# Patient Record
Sex: Female | Born: 1960 | Race: Black or African American | Hispanic: No | Marital: Single | Smoking: Never smoker
Health system: Western US, Academic
[De-identification: ages and names within clinical notes are randomized; demographics above are authoritative.]

---

## 2008-02-16 MED ORDER — TRIMETHOPRIM-SULFAMETHOXAZOLE 160 MG-800 MG TAB
160-800 mg | ORAL | Status: AC
Start: 2008-02-16 — End: 2008-02-16
  Administered 2008-02-16: 06:00:00 via ORAL

## 2008-02-16 MED FILL — TRIMETHOPRIM-SULFAMETHOXAZOLE 160 MG-800 MG TAB: 160-800 mg | ORAL | Qty: 2

## 2008-02-16 NOTE — ED Provider Notes (Addendum)
HPI Comments: Pt with sore on right side of chin, drained Starry core tonight. Also sores in nose. Tongue sore for a few weeks.    Other  The history is provided by the patient. This is a new problem. The current episode started more than 2 days ago. The problem occurs constantly. The problem has been gradually worsening. Pertinent negatives include no chest pain, no abdominal pain, no headaches and no shortness of breath. Nothing worsens the symptoms. Nothing relieves the symptoms. She has tried nothing for the symptoms.        Past Medical History   Diagnosis Date   ??? Hypertension    ??? Other Ill-Defined Conditions      morbid obesity          Past Surgical History   Procedure Date   ??? Hx gi      gastric bypass   ??? Hx cholecystectomy            No family history on file.     History   Social History   ??? Marital Status: Single     Spouse Name: N/A     Number of Children: N/A   ??? Years of Education: N/A   Occupational History   ??? Not on file.   Social History Main Topics   ??? Tobacco Use: Never   ??? Alcohol Use: No   ??? Drug Use: No   ??? Sexually Active: No   Other Topics Concern   ??? Not on file   Social History Narrative   ??? No narrative on file           ALLERGIES: Review of patient's allergies indicates no known allergies.      Review of Systems   Constitutional: Negative for fever and chills.   HENT:        Sore mouth   Respiratory: Negative for shortness of breath.    Cardiovascular: Negative for chest pain.   Gastrointestinal: Negative for abdominal pain.   Skin:        Lesion on chin   Neurological: Negative for headaches.   All other systems reviewed and are negative.      Filed Vitals:    02/15/2008  9:54 PM   BP: 195/101   Pulse: 84   Temp: 98.3 ??F (36.8 ??C)   Resp: 20   Height: 5\' 5"  (1.651 m)   Weight: 262 lb (118.842 kg)   SpO2: 96%              Physical Exam   Nursing note and vitals reviewed.   Constitutional: She is oriented. She appears well-developed and well-nourished. She appears not diaphoretic. No distress.   HENT:   Head: Normocephalic and atraumatic.       Right Ear: External ear normal.   Left Ear: External ear normal.   Nose: Nose normal.        Tongue red and slick   Eyes: Conjunctivae and extraocular motions are normal. Pupils are equal, round, and reactive to light.   Neck: Normal range of motion. Neck supple.   Cardiovascular: Normal rate and normal heart sounds.    Pulmonary/Chest: Effort normal and breath sounds normal.   Abdominal: Soft. Bowel sounds are normal. She exhibits no distension.   Musculoskeletal: Normal range of motion. She exhibits no edema and no tenderness.   Neurological: She is alert and oriented. No cranial nerve deficit. She exhibits normal muscle tone. Coordination normal.   Skin: Skin is warm and  dry. No rash noted. She is not diaphoretic. There is erythema. No pallor.   Psychiatric: She has a normal mood and affect. Her behavior is normal. Judgment and thought content normal.        Coding

## 2008-02-16 NOTE — ED Notes (Signed)
Pt awake alert voices understanding of discharge and RX instructions

## 2011-03-08 ENCOUNTER — Emergency Department
Admit: 2011-03-08 | Discharge: 2011-03-08 | Disposition: A | Discharge status: Home Health | Payer: Self-pay | Attending: Emergency Medicine | Admitting: Emergency Medicine

## 2011-03-08 LAB — CBC WITH DIFF, BLOOD
Abs Lymphs: 1.7 10*3/uL (ref 0.8–3.1)
Abs Monos: 0.5 10*3/uL (ref 0.2–0.8)
Absolute Neutrophil Count: 4 10*3/uL (ref 1.6–7.0)
Eosinophils: 2 % (ref 1–7)
Hct: 34.5 % (ref 34.0–45.0)
Hgb: 11.7 gm/dL (ref 11.2–15.7)
Imm Gran %: 1 % (ref 0–1)
Lymphocytes: 27 % (ref 19–53)
MCH: 31.1 pg (ref 26.0–32.0)
MCHC: 33.9 % (ref 32.0–36.0)
MCV: 91.8 um3 (ref 79.0–95.0)
MPV: 9.3 fL — ABNORMAL LOW (ref 9.4–12.4)
Monocytes: 8 % (ref 5–12)
Plt Count: 235 10*3/uL (ref 140–370)
RBC: 3.76 10*6/uL — ABNORMAL LOW (ref 3.90–5.20)
RDW: 12.5 % (ref 12.0–14.0)
Segs: 63 % (ref 34–71)
WBC: 6.2 10*3/uL (ref 4.0–10.0)

## 2011-03-08 LAB — URINALYSIS
Bilirubin: NEGATIVE
Bilirubin: NEGATIVE
Blood: NEGATIVE
Blood: NEGATIVE
Glucose: NEGATIVE
Glucose: NEGATIVE
Hyaline Cast: >5 — AB (ref 0–?)
Ketones: NEGATIVE
Ketones: NEGATIVE
Leuk Esterase: NEGATIVE
Nitrite: NEGATIVE
Nitrite: NEGATIVE
RBC: <1 (ref 0–?)
Specific Gravity: 1.036 — ABNORMAL HIGH (ref 1.002–1.030)
Specific Gravity: 1.047 — ABNORMAL HIGH (ref 1.002–1.030)
Urobilinogen: 2 — AB (ref 0.2–1)
Urobilinogen: 2 — AB (ref 0.2–1)
pH: 5.5 (ref 5.0–8.0)
pH: 5 (ref 5.0–8.0)

## 2011-03-08 LAB — LIVER PANEL, BLOOD
ALT (SGPT): 29 U/L (ref 0–33)
AST (SGOT): 37 U/L — ABNORMAL HIGH (ref 0–32)
Albumin: 4.3 g/dL (ref 3.5–5.2)
Alkaline Phos: 83 U/L (ref 35–140)
Bilirubin, Dir: <0.1 mg/dL (ref <0.2)
Bilirubin, Tot: 0.2 mg/dL (ref <1.2)
Total Protein: 7.2 g/dL (ref 6.0–8.0)

## 2011-03-08 LAB — BASIC METABOLIC PANEL, BLOOD
BUN: 24 mg/dL — ABNORMAL HIGH (ref 6–20)
Bicarbonate: 30 mmol/L — ABNORMAL HIGH (ref 22–29)
Calcium: 9.3 mg/dL (ref 8.6–10.5)
Chloride: 99 mmol/L (ref 98–107)
Creatinine: 1.16 mg/dL — ABNORMAL HIGH (ref 0.51–0.95)
Glucose: 80 mg/dL (ref 70–115)
Potassium: 4 mmol/L (ref 3.5–5.1)
Sodium: 138 mmol/L (ref 136–145)

## 2011-03-08 LAB — GFR: GFR: >60 mL/min

## 2011-03-08 LAB — LIPASE, BLOOD: Lipase: 19 U/L (ref 13–60)

## 2011-03-08 LAB — KOH PREP: KOH Preparation: NONE SEEN

## 2011-03-08 LAB — WET PREP

## 2011-03-08 MED ORDER — PENICILLIN V POTASSIUM 500 MG OR TABS
500.0000 mg | ORAL_TABLET | Freq: Once | ORAL | Status: DC | PRN
Start: 2011-03-08 — End: 2011-03-08
  Filled 2011-03-08: qty 1

## 2011-03-08 MED ORDER — PROPARACAINE HCL 0.5 % OP SOLN
2.0000 [drp] | Freq: Once | OPHTHALMIC | Status: DC | PRN
Start: 2011-03-08 — End: 2011-03-08
  Filled 2011-03-08: qty 300

## 2011-03-08 MED ORDER — HYDROMORPHONE HCL 1 MG/ML IJ SOLN
1.0000 mg | Freq: Once | INTRAMUSCULAR | Status: DC | PRN
Start: 2011-03-08 — End: 2011-03-08
  Filled 2011-03-08: qty 1

## 2011-03-08 MED ORDER — ONDANSETRON 4 MG OR TBDP
4.0000 mg | ORAL_TABLET | Freq: Once | ORAL | Status: DC | PRN
Start: 2011-03-08 — End: 2011-03-08
  Filled 2011-03-08: qty 1

## 2011-03-08 MED ORDER — FLUORESCEIN SODIUM 1 MG OP STRP
1.0000 | ORAL_STRIP | Freq: Once | OPHTHALMIC | Status: DC | PRN
Start: 2011-03-08 — End: 2011-03-08
  Filled 2011-03-08: qty 1

## 2011-03-08 MED ORDER — OXYCODONE-ACETAMINOPHEN 5-325 MG OR TABS
2.0000 | ORAL_TABLET | Freq: Once | ORAL | Status: DC | PRN
Start: 2011-03-08 — End: 2011-03-08
  Filled 2011-03-08: qty 2

## 2011-03-11 NOTE — ED Notes (Addendum)
==============================   ATTENDING NOTE =============================    05/27 1659    Michelle Cord, MD Attending            seen with dr. Francesco Runner          50 yo female with lower abd pain. cramping, started last          night. no fevers. no urinary symptoms. sexually active with          one partner, uses condoms, no discharge. some vomiting and          diarrhea 3d ago but that has resolved. h/o gastric bypass          surgery 4 yrs ago [place]. pt reports intermittent          similar abd pain in the past. on evaluation, her symptoms          have resolved. previously chronically on percocet - has been          out of a few weeks.                     Pmhx/Shx: triage note confirmed                    PE:          htn, tachy          well appearing female in nad          eomi, o/p moist, poor dentition with multiple caries and          missing teeth, no obvious abscess - pt has appt with dentist          on tues          neck not stiff          rrr -m          lungs cta          abd soft, nontender, no guarding or rebound, no cva ttp          ext without edema or asymmetry                    I/P          50 yo female with intermittent chronic lower abd pain, here          with cramping in lower abd since last night, now resolved.          appears well, no ttp on exam. agree with labs, urine, pelvic          exam, do not think imaging is needed at this time as          symptoms have resolved

## 2011-03-11 NOTE — ED Notes (Addendum)
=================================   ORDERS ==================================    ACT- MD    MD   RN   AP                                           IVE DATE  TIME TIME TIME  TREATMENT ORDERS                       MD   RN     ---------------------------------------------------------------------------

## 2011-03-11 NOTE — ED Notes (Addendum)
 50 year old female with a history of hypertension and a          gastric bypass 4 years ago here for evaluation of abdominal          pain in her suprapubic area. Patient reports mild nausea          with an episode of vomiting and that she says was nonbloody          nonbilious earlier today. She denies fevers, chest pain or          shortness of breath, back pain, flank pain, dysuria, or any          other concerns. She has not had dysuria or abnormal vaginal          discharge.      PAST MEDICAL/SURGICAL HISTORY          N/A           FAMILY HISTORY- N/A                                                          SOCIAL HISTORY-       SMOKING   ALCOHOL   ILLICIT DRUGS   HOMELESS   MARRIED   EMPLOYED         N/A       N/A           N/A           N/A        S             DISABLED  OTHER- N/A                                                                   REVIEW OF SYSTEMS- N/A                                                         PHYSICAL EXAM  05/27 1712    Meade Maw , MD            Vitals: Vitals as noted above, afebrile, O2 sat low-normal          at 95% on room air          Gen: WDWN aa f, a&ox3, in NAD, speaking in full sentences          Skin: No suspicious lesions or rashes          HEENT: NCAT, PERRL, EOMI, no lad, o/p clear, MMM          Neck: Supple, nontender          CVS: RRR, no m/r/g          Lungs: CTAB, no w/c          Abd: Soft, ND, mild tenderness to palpation in suprapubic          area, no r/g  Ext: MAE, no c/e/e          Back: No ttp in midline c/t/l/s spine          Neuro: CN 2-12 intact, normal strength and sensation          throughout          GU: external vaginal mucosa normal, vaginal vault with white          discharge, cervix normal appearing, no cervical motion          tenderness, no adnexal tenderness      IMPRESSION  05/27 2134    Meade Maw , MD            abdominal pain of unclear etiology--suprapubic tenderness          without localizing right lower  quadrant or left lower          quadrant tenderness to suspect appendicitis or          diverticulitis, patient is afebrile, denies high-risk sexual          contacts, denies abnormal vaginal discharge, no dysuria to          suggest UTI--pain is now resolved after treatment with pain          medicines--we'll give patient strict return precautions for          reevaluation if her pain returns or worsens in          anyway--patient is also aware that her blood pressure is          elevated, takes antihypertensives, and will keep her blood          pressure log for her outpatient doctor--patient states she          frequently has nausea from eating too much ever said she had          a gastric bypass--she states her level of nausea is normal          for her--also reports dental pain in her right posterior          upper molar where she has a cracked tooth--no fluctuance of          the gums--suspect dental abscess and will treat with          penicillin      MEDICAL DECISION MAKING  05/27 2134    Meade Maw , MD            Patient was discharged with the following diagnosis and plan          provided to the patient in discharge paperwork. Patient          verbalized understanding and agreement with the following:          DIAGNOSIS: Dental Pain          Abdominal Pain of Unclear Etiology           TREATMENT:           1. Take Percocet and Penicillin as prescribed.           FOLLOW-UP: Call your primary care doctor for follow-up on          Tuesday for re-evaluation.          Call your Dentist for follow-up on Tuesday.          RETURN TO THE ED if your symptoms worsen in any way.    CASE PRESENTED TO- N/A                                                         =============================  PHYSICIAN NOTES =============================    05/27 1828    Meade Maw , MD            PATIENT WAS REASSESSED AND NOW STATES HER ABD PAIN IS          COMPLETELY GONE. STILL REPORTS MILD PAIN IN HER RIGHT LEG,          BUT  WOULD LIKE TO GO HOME. GIVEN STRICT RETURN PRECAUTIONS.    05/27 1902    Beola Cord, MD Attending            hyptertensive, no complaints. just took her bp meds, does          not want to stay for recheck. states she'll take her bp at          home    05/27 1915    Meade Maw , MD            upon discharge, patient's bp was checked and was found to be          elevated. She states she is due for her bp meds and took her          medication. She refuses to stay for a repeat BP check and          says that she will keep a bp log for follow-up with her pmd          on tuesday. All symptoms now resolved.      ============================= PROCEDURE NOTES =============================      ================================ LAB NOTES ================================    05/27 1532    Meade Maw , MD            cr 1.16    05/27 1559    Meade Maw , MD            UA DIRTY--WILL REPEAT, ? INFECTION    05/27 1748    Meade Maw , MD            ua neg for infection      ================================= IMAGES ==================================      ============================== MD DISCHARGE ===============================    DISCHARGE PHYSICIAN- Meade Maw                                              CHIEF COMPLAINT- Abdominal Pain       CASE PRESENTED TO- Alicia Minns        CONDITION OF DISCHARGE- Improving       WAS THIS VISIT FOR A WORK RELATED ILLNESS OR INJURY- No                      PRIMARY CARE PHYSICIAN- [facility]                                       HAS PCP BEEN CONTACTED- N/A           H&P NOTE WAS DICTATED- No     +-------------------------- DISCHARGE DIAGNOSIS --------------------------+          789.00  Groin Pain                                                     +-------------------------  DISCHARGE INSTRUCTIONS ------------------------+    05/27 1838    Meade Maw , MD            PHYSICIAN- Beola Cord, MD Attending  FOLLOW-UP(DAYS)- N/A              APPOINTMENT- N/A   RETURN TO- N/A               LANGUAGE- English             INSTRUCTIONS-          MEDICATIONS-          REFERRAL CLINICS-            [facility] - [facility]                                            [facility] - [facility]                                      REFERRAL PHYSICIANS-          ADDITIONAL INSTRUCTIONS-            N/A                                                   05/27 1842    Meade Maw , MD            PHYSICIAN- Beola Cord, MD Attending  FOLLOW-UP(DAYS)- N/A              APPOINTMENT- N/A   RETURN TO- N/A              LANGUAGE- English             INSTRUCTIONS-          MEDICATIONS-          REFERRAL CLINICS-            [facility] - [facility]                                            D[facility] - [facility]                                      REFERRAL PHYSICIANS-          ADDITIONAL INSTRUCTIONS-            N/A                                                     +----------------------- MEDICATION RECONCILIATION -----------------------+    ---------------------------- CURRENT MEDICATION ---------------------------  MEDICATION NAME                       DOSAGE  FREQUENCY  ---------------------------------------------------------------------------    Gabapentin                            N/A                   N/A             Enalapril Maleate                     N/A                   N/A             Norco                                 N/A                   N/A             Clonidine HCl                         N/A                   N/A             Quetiapine Fumarate                   N/A                   N/A             Atenolol Please review with MD.       N/A                   N/A               ---------------------------- STOPPED MEDICATION ---------------------------  MEDICATION NAME                       DOSAGE                FREQUENCY  ---------------------------------------------------------------------------      ---------------------------- UPDATED MEDICATION  ---------------------------  MEDICATION NAME                       DOSAGE                FREQUENCY  ---------------------------------------------------------------------------      ----------------------------- ADDED MEDICATION ----------------------------  MEDICATION NAME                       DOSAGE                FREQUENCY  ---------------------------------------------------------------------------    Percocet Please review with MD.       1 MG                  When Necessary  Penicillin V Potassium                1 MG                  Every 6 Hours       ============================= FOLLOW UP NOTES =============================    05/29 03/10/2011  1536    Kathline Magic, RN  Reason for Addendum or Follow Up: Routine ED Patient Call          Back          Action Taken: Patient contacted by telephone          happy with care.  feeling better.

## 2011-03-11 NOTE — ED Notes (Addendum)
 ============================== ADMIT SUMMARY ==============================    RECEIVING NURSE -    ED NURSE -     +------------------------------- ALLERGIES -------------------------------+   No known drug allergies (03/08/2011);     +-------------------------- ADMITTING DIAGNOSIS --------------------------+                                                                                 +--------------------------- ADMITTING SERVICE ---------------------------+  ADMISSION SERVICE -    LEVEL OF CARE -    ATTENDING -    RESIDENT  -      +------------------------ MOST RECENT VITAL SIGNS ------------------------+  BP - 194/93                          PULSE -                                RESPIRATIONS -                       O2 SAT -                               TEMPERATURE -                        MODE -                                 GCS TOTAL -                                                                 PAIN -                               PAIN QUALITY -                         PAIN LOCATION -                                                             DATE/TIME - 03/08/2011 1908    +-------------------------------- FLUIDS ---------------------------------+  DATE  TIME  IV FLUID           L/R   LOCATION          SIZE   HUNG ABSORBED  ---------------------------------------------------------------------------  TOTAL IV:                         0 ml    TOTAL OUTPUT:     0 ml               TOTAL PO:                         0 ml    +------------------------------ MEDICATIONS ------------------------------+  DATE  TIME  MEDICATION                           VERIFYING RN      RN INIT  ---------------------------------------------------------------------------    05/27 1529  Hydromorphone HCl 1 MG IV                                  JLS  05/27 1810  Ondansetron - ODT 4 MG PO                                  JLS  05/27 1832  Oxycodone-Acetaminophen 2 Tabs PO                           EKE  05/27 1832  Penicillin V Potassium 500 MG PO                           EKE    +------------------------------- LABS DONE -------------------------------+  ACT- MD    MD   RN   AP                                         +INITIALS+  IVE DATE  TIME TIME TIME  TREATMENT ORDERS                      MD  RN  AP   ---------------------------------------------------------------------------        05/27 1435 1439       Basic Metabolic Panel                 [facility] JLS                                  Collect New Specimen                              Specimen Type:  Blood      05/27 1435 1439       Liver Panel, Lipase                   [facility] JLS                                  Collect New Specimen                              Specimen  Type:  Blood      05/27 1435 1439       CBC with Differential                 [facility] JLS                                  Collect New Specimen                              Specimen Type:  Blood      05/27 1435 1439       Urinalysis, Clean Catch               [facility] JLS                                  Collect New Specimen                              Specimen Type:  Urine      05/27 1448 1458       KOH Prep, Culture Genital, Wet Prep,  [facility] JLS                                Chlamydia/GC PCR, Genital                              Collect New Specimen                              Specimen Type:  Genital      05/27 1529 1613       Urinalysis, Clean Catch               [facility] JLS                                  Collect New Specimen                              Specimen Type:  Urine    EKG DONE - NO    +---------------------------- PROCEDURE NOTES ----------------------------+      +------------------------ CURRENT MEDICATION --------------------------+       Atenolol Please review with MD.    for  Continuous     Quetiapine Fumarate    for  Continuous     Clonidine HCl    for  Continuous     Norco    for  Continuous     Enalapril Maleate    for  Continuous     Gabapentin    for   Continuous  +------------------------ OTHER NURSING PROCEDURES -----------------------+                                                                               +---------------------------  PSYCHOSOCIAL NEEDS --------------------------+  +------------------------- BARRIERS TO LEARNING --------------------------+    ASSESSMENT- Assessment Done with Findings of:                                    BARRIERS-    No Barriers  SUPPORT PERSON-                                                                 SPECIAL CONSIDERATIONS-                                                                                ============================== TRIAGE RECORD ==============================    CHIEF COMPLAINT- Abdominal Pain                                                TIME OF ONSET- N/A                 :   +-STANDING-+     +--SEATED--+  TRIAGE CATEGORY- 2                   :   BP     PULSE     BP     PULSE             ROOM- 3                   : N/A/N/A   N/A    162/89    103  MODE OF ARRIVAL- Car                 :   IN CUSTODY- No                       :  TEMP MODE O2SAT RESP  LMP        PRIVATE MD- N/A                      : 97.4   Oral  95    16  N/A                                                    :   WORK RELATED INJURY- No              : +--GCS--+     +--PUPILS--+  RETURN IN 72 HOURS- None             : E V M TOT     L R RESPONSE  TRIAGE NURSE- Tanya Sigillo          :  4 5 6  15      X X  N/A                  IS THIS VISIT RELATED TO ASSAULT- No                        IS THIS VISIT RELATED TO DOMESTIC VIOLENCE- No                        DOES THIS PATIENT EXPRESS SUICIDAL IDEATION/INTENT- No                            PAIN TYPE- V    NOW-  7   TOLERABLE AT-  4     QUALITY- Constant                PAIN LOCATION- general    RADIATES TO- none                            LATEX ALLERGY FORM- N/A   LATEX ALLERGY- N/A   TETANUS- N/A              IMMUNIZATION- N/A         PED HEIGHT- N/A      WEIGHT- N/A   KG  ADDITIONAL FORMS-  N/A  +------------------------- CARE PRIOR TO ARRIVAL -------------------------+   none  +------------------------------- ALLERGIES -------------------------------+    MEDICATION            ALLERGY                                   REACTION  ---------------------------------------------------------------------------    N/A                   No known drug allergies                   Unknown Rea    +------------------------------ MEDICATIONS ------------------------------+    MEDICATION NAME                       DOSAGE                FREQUENCY  ---------------------------------------------------------------------------    Gabapentin                            N/A                   N/A             Enalapril Maleate                     N/A                   N/A             Norco                                 N/A                   N/A             Clonidine HCl  N/A                   N/A             Quetiapine Fumarate                   N/A                   N/A             Atenolol Please review with MD.       N/A                   N/A               +-------------------------- PAST MEDICAL HISTORY -------------------------+                                                                               +---------------------------- CURRENT HISTORY ----------------------------+   c/o abd pain x 2 days with n/v/d. denies fever. denies dysuria. also c/o   right tooth pain that started today with r mouth swelling noted.     =========================== REASSESSMENT VITALS ===========================                         R    T    M         ET                                                     E    E    O         CO2 PU-                                                S    M    D O2  ET  TY- PIL +---GCS----+            DATE  TIME   BP    HR  P    P    E SAT CO2 PE  L R E  V  M  TOT  POSITION         ---------------------------------------------------------------------------    05/27 1428 162/89  103  16  97.4  O 95              4  5  6  15    Seated    05/27 1428    /                                                  Standing  05/27 1840 203/96  60  16  97.9  O 100  Seated    05/27 1908 194/93                                                                        +-----------------PAIN-----------------+              T                                                           I                Y  N  T                                                     N                P  O  O                                                     I    DATE  TIME  E  W  L LOCATION   QUALITY      RADIATES    COMMENT         T    ---------------------------------------------------------------------------    05/27 1428  V  7  4 general    Constant     none        Triage          TS   05/27 1428                                              Triage          TS   05/27 1840  V  0  3 n          N/A          n                           odc  05/27 1908                                                              odc    ============================= NURSE DISCHARGE =============================    DISCHARGE NURSE- Artis Delay  DISPOSITION- Discharged from ED             WITH- By Self                    ACCOMPANIED BY- N/A                                                          EQUIP W/TRANSPORT-    N/A                                                                  TIME OF DISPOSITION- 03/08/2011 1915  LEFT ED VIA- [facility]ulate                  TRANSFERRED TO- N/A                   REASON- N/A                            ADMITTED TO- N/A                      ROOM- N/A                              NURSE REPORT TO- N/A                  REPORT TIME- X  N/A                    BELONGINGS- N/A                       ENVELOPE NUMBER- N/A                   CONDITION ON DISCHARGE- Stable                                               AFTERCARE PROVIDED WITH- Written and Verbal                                   WHAT AFTERCARE INSTRUCTIONS WERE GIVEN AND REVIEWED WITH PATIENT  AND/OR FAMILY?-    (see EPIC instructions)   Abdominal Pain - F/U 24 Hours (abdominal pain of unknown etiology); Dental      Caries  (cavity, caries, dental pain, odontalgia);   IN WHAT LANGUAGE WERE THESE GIVEN?- English        OTHER: N/A                TRANSLATED BY- N/A                       OTHER: N/A  GCS-  E: N/A V: N/A M: N/A TOTAL: N/A  PAIN LEVEL UPON DISPOSITION- 0  OUT OF 10  WHAT MEDS WERE PROVIDED FROM DISCHARGE PYXIS?-    None                                                                 RX TO BE FILLED FOR-    Percocet Please review with MD.; Penicillin V Potassium;   DID THE PATIENT OR RESPONSIBLE CARE PROVIDER UNDERSTAND THE FOLLOW UP  RECOMMENDATION?- Yes                   DISPOSITION BY- RN                    NURSING LEVEL- 3. ED Stay with Multiple Interactions                           +------------------------------- RESTRAINTS ------------------------------+  ALTERNATIVE ATTEMPTS:    -------------------------- RESTRAINT ASSESSMENTS --------------------------  ============================== POINT OF CARE ==============================    OCCULT BLOOD STOOL RESULTS   Norm results neg.  DATE  TIME    RESULTS     DONE BY    CONTROL POSTIVE    CONTROL NEGATIVE      URINE PREGNANCY TEST   Norm results for non pregnant females neg.  DATE  TIME    RESULTS     DONE BY    CONTROL POSTIVE    05/27 1444    Neg         JLS                 Yes                    URINE DIP   Norm results - All neg. with pH 5.0 to 8.0 and urobili 0.2 to 1.0                LEUKO  NI-        PRO-  GLU-          URO-                   DATE  TIME    CYTE  TRITE PH    TEIN  COSE  KETONES BILI  BILI  BLOOD BY   05/27 1444    neg   neg   5.0   tr    neg   neg     1.0   1+    neg   JLS    FINGER STICK GLUCOSE   Norm results 65 to 110 mg/dl  DATE  TIME    RESULTS     DONE BY      FINGER STICK HEMOGLOBIN   Norm results adult  female 66 to 17 gm/dl  Norm results adult female 12 to 16 gm/dl  DATE  TIME    RESULTS     DONE BY      ============================== MD NOTES H&P ===============================    TIME OF NOTE WRITTEN- N/A  CHIEF COMPLAINT- Abdominal Pain                                                HISTORY OF PRESENT ILLNESS  05/27 2135    Meade Maw , MD

## 2012-09-30 ENCOUNTER — Emergency Department: Payer: Self-pay | Admitting: Emergency Medicine

## 2012-09-30 LAB — DRUG SCREEN, URINE
Amphetamines, Ur Screen: NEGATIVE (ref ?–1000)
Cocaine Metabolite,Ur ~~LOC~~: NEGATIVE (ref ?–300)
MDMA (Ecstasy)Ur Screen: NEGATIVE (ref ?–500)
Opiate, Ur Screen: NEGATIVE (ref ?–300)
Phencyclidine (PCP) Ur S: NEGATIVE (ref ?–25)

## 2012-09-30 LAB — COMPREHENSIVE METABOLIC PANEL
Albumin: 3.2 g/dL — ABNORMAL LOW (ref 3.4–5.0)
Alkaline Phosphatase: 98 U/L (ref 50–136)
Anion Gap: 2 — ABNORMAL LOW (ref 7–16)
BUN: 19 mg/dL — ABNORMAL HIGH (ref 7–18)
Bilirubin,Total: 0.3 mg/dL (ref 0.2–1.0)
Calcium, Total: 8 mg/dL — ABNORMAL LOW (ref 8.5–10.1)
Creatinine: 0.78 mg/dL (ref 0.60–1.30)
Glucose: 115 mg/dL — ABNORMAL HIGH (ref 65–99)
Osmolality: 277 (ref 275–301)
Potassium: 4.4 mmol/L (ref 3.5–5.1)
Sodium: 137 mmol/L (ref 136–145)
Total Protein: 6.8 g/dL (ref 6.4–8.2)

## 2012-09-30 LAB — CBC
HGB: 9.8 g/dL — ABNORMAL LOW (ref 12.0–16.0)
RBC: 3.77 10*6/uL — ABNORMAL LOW (ref 3.80–5.20)

## 2012-09-30 LAB — TROPONIN I: Troponin-I: <0.02 ng/mL

## 2012-09-30 LAB — ETHANOL: Ethanol %: <0.003 % (ref 0.000–0.080)

## 2012-10-20 ENCOUNTER — Emergency Department: Payer: Self-pay | Admitting: Emergency Medicine

## 2012-10-20 LAB — CBC
HCT: 31.2 % — ABNORMAL LOW (ref 35.0–47.0)
HGB: 9.9 g/dL — ABNORMAL LOW (ref 12.0–16.0)
MCH: 25.7 pg — ABNORMAL LOW (ref 26.0–34.0)
MCV: 81 fL (ref 80–100)
Platelet: 205 10*3/uL (ref 150–440)

## 2012-10-20 LAB — COMPREHENSIVE METABOLIC PANEL
Albumin: 3.4 g/dL (ref 3.4–5.0)
Alkaline Phosphatase: 124 U/L (ref 50–136)
Anion Gap: 4 — ABNORMAL LOW (ref 7–16)
Calcium, Total: 8.8 mg/dL (ref 8.5–10.1)
Chloride: 112 mmol/L — ABNORMAL HIGH (ref 98–107)
EGFR (African American): >60
Glucose: 51 mg/dL — ABNORMAL LOW (ref 65–99)
Osmolality: 285 (ref 275–301)
Potassium: 4.6 mmol/L (ref 3.5–5.1)

## 2012-10-20 LAB — LIPASE, BLOOD: Lipase: 129 U/L (ref 73–393)

## 2012-10-21 ENCOUNTER — Emergency Department: Payer: Self-pay | Admitting: Unknown Physician Specialty

## 2012-10-21 LAB — URINALYSIS, COMPLETE
Blood: NEGATIVE
Glucose,UR: NEGATIVE mg/dL (ref 0–75)
Protein: NEGATIVE
RBC,UR: 3 /HPF (ref 0–5)
Specific Gravity: 1.02 (ref 1.003–1.030)
WBC UR: 8 /HPF (ref 0–5)

## 2012-10-21 LAB — COMPREHENSIVE METABOLIC PANEL
Alkaline Phosphatase: 132 U/L (ref 50–136)
BUN: 14 mg/dL (ref 7–18)
Calcium, Total: 8.9 mg/dL (ref 8.5–10.1)
Co2: 27 mmol/L (ref 21–32)
EGFR (African American): >60
Glucose: 92 mg/dL (ref 65–99)
Potassium: 4 mmol/L (ref 3.5–5.1)
SGPT (ALT): 26 U/L (ref 12–78)
Sodium: 138 mmol/L (ref 136–145)

## 2012-10-21 LAB — CBC
MCH: 26.2 pg (ref 26.0–34.0)
MCV: 81 fL (ref 80–100)
Platelet: 207 10*3/uL (ref 150–440)
RDW: 16.5 % — ABNORMAL HIGH (ref 11.5–14.5)

## 2012-11-09 ENCOUNTER — Emergency Department: Payer: Self-pay | Admitting: Internal Medicine

## 2012-12-28 ENCOUNTER — Observation Stay: Payer: Self-pay | Admitting: Obstetrics and Gynecology

## 2012-12-28 LAB — COMPREHENSIVE METABOLIC PANEL
Anion Gap: 6 — ABNORMAL LOW (ref 7–16)
Bilirubin,Total: 0.3 mg/dL (ref 0.2–1.0)
Glucose: 116 mg/dL — ABNORMAL HIGH (ref 65–99)
Osmolality: 277 (ref 275–301)
SGPT (ALT): 25 U/L (ref 12–78)

## 2012-12-28 LAB — PREGNANCY, URINE: Pregnancy Test, Urine: NEGATIVE m[IU]/mL

## 2012-12-28 LAB — URINALYSIS, COMPLETE
Ketone: NEGATIVE
Nitrite: NEGATIVE
Protein: NEGATIVE
Specific Gravity: 1.004 (ref 1.003–1.030)
Squamous Epithelial: 2
WBC UR: NONE SEEN /HPF (ref 0–5)

## 2012-12-28 LAB — WET PREP, GENITAL

## 2012-12-28 LAB — CBC
MCV: 80 fL (ref 80–100)
Platelet: 212 10*3/uL (ref 150–440)

## 2012-12-28 LAB — LIPASE, BLOOD: Lipase: 131 U/L (ref 73–393)

## 2012-12-30 LAB — PATHOLOGY REPORT

## 2013-01-30 ENCOUNTER — Emergency Department: Payer: Self-pay | Admitting: Emergency Medicine

## 2013-01-30 LAB — COMPREHENSIVE METABOLIC PANEL
Albumin: 3.4 g/dL (ref 3.4–5.0)
Chloride: 109 mmol/L — ABNORMAL HIGH (ref 98–107)
Creatinine: 1.06 mg/dL (ref 0.60–1.30)
EGFR (African American): >60
Potassium: 4.7 mmol/L (ref 3.5–5.1)
SGOT(AST): 29 U/L (ref 15–37)
SGPT (ALT): 18 U/L (ref 12–78)
Total Protein: 6.7 g/dL (ref 6.4–8.2)

## 2013-01-30 LAB — URINALYSIS, COMPLETE
Bilirubin,UR: NEGATIVE
Glucose,UR: NEGATIVE mg/dL (ref 0–75)
Nitrite: NEGATIVE
Protein: NEGATIVE
Specific Gravity: 1.027 (ref 1.003–1.030)
WBC UR: 2 /HPF (ref 0–5)

## 2013-01-30 LAB — CBC
HCT: 26.2 % — ABNORMAL LOW (ref 35.0–47.0)
HGB: 8.3 g/dL — ABNORMAL LOW (ref 12.0–16.0)
MCH: 25.5 pg — ABNORMAL LOW (ref 26.0–34.0)
MCHC: 31.7 g/dL — ABNORMAL LOW (ref 32.0–36.0)
Platelet: 189 10*3/uL (ref 150–440)
RBC: 3.26 10*6/uL — ABNORMAL LOW (ref 3.80–5.20)
RDW: 17.8 % — ABNORMAL HIGH (ref 11.5–14.5)

## 2013-04-28 ENCOUNTER — Encounter (HOSPITAL_COMMUNITY): Payer: Self-pay | Admitting: Emergency Medicine

## 2013-04-28 ENCOUNTER — Emergency Department (INDEPENDENT_AMBULATORY_CARE_PROVIDER_SITE_OTHER)
Admission: EM | Admit: 2013-04-28 | Discharge: 2013-04-28 | Disposition: A | Discharge status: Home / Self Care | Payer: Medicare Other | Source: Home / Self Care

## 2013-04-28 DIAGNOSIS — IMO0002 Reserved for concepts with insufficient information to code with codable children: Secondary | ICD-10-CM

## 2013-04-28 DIAGNOSIS — F99 Mental disorder, not otherwise specified: Secondary | ICD-10-CM

## 2013-04-28 DIAGNOSIS — F489 Nonpsychotic mental disorder, unspecified: Secondary | ICD-10-CM

## 2013-04-28 DIAGNOSIS — I10 Essential (primary) hypertension: Secondary | ICD-10-CM

## 2013-04-28 MED ORDER — ENALAPRIL MALEATE 20 MG PO TABS: 20.0000 mg | ORAL_TABLET | Freq: Every day | ORAL | Status: AC

## 2013-04-28 MED ORDER — QUETIAPINE FUMARATE ER 300 MG PO TB24: 300.0000 mg | ORAL_TABLET | Freq: Every day | ORAL | Status: AC

## 2013-04-28 MED ORDER — HYDROCODONE-ACETAMINOPHEN 5-325 MG PO TABS
2.0000 | ORAL_TABLET | Freq: Once | ORAL | Status: AC
  Administered 2013-04-28: 2 via ORAL

## 2013-04-28 MED ORDER — HYDROCODONE-ACETAMINOPHEN 5-325 MG PO TABS
ORAL_TABLET | ORAL | Status: AC
  Filled 2013-04-28: qty 2
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
## 2013-04-28 NOTE — ED Notes (Addendum)
Pt c/o arthritis in legs and a spot on the bottom of her right foot for about two weeks. She has been walking a lot due to not having transportation. Pt reports abdominal pain started yesterday. Feels like she has a knot in her stomach. Thinks it may be a hernia. Pt reports she has a PCP but she is currently homeless and cannot afford to go. Pt reports she is out of her Perocet, Clonidine, and Xanax. Pt is alert and oriented.

## 2014-02-19 IMAGING — CT CT ABD-PELV W/ CM
1 of 2 series · 14 of 32 positions shown, 18 images · non-contrast
Comparison: none

REASON FOR EXAM: (1) abd pain; (2) abd pain
COMMENTS:

[Series 2: 3mm soft tissue · axial · 0.62mm/px · z∈[-1040,-614]mm · 14 of 156 slices shown, 18 images]
[im 7/156  soft-tissue]
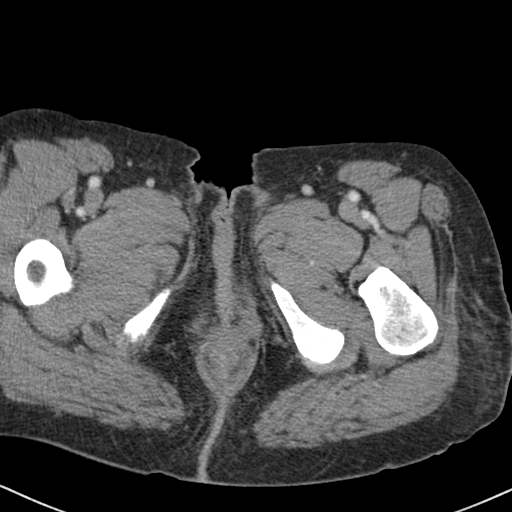
[im 7/156  bone]
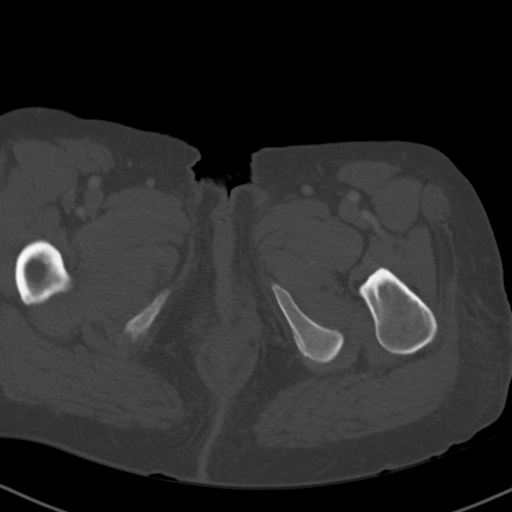
[im 20/156  soft-tissue]
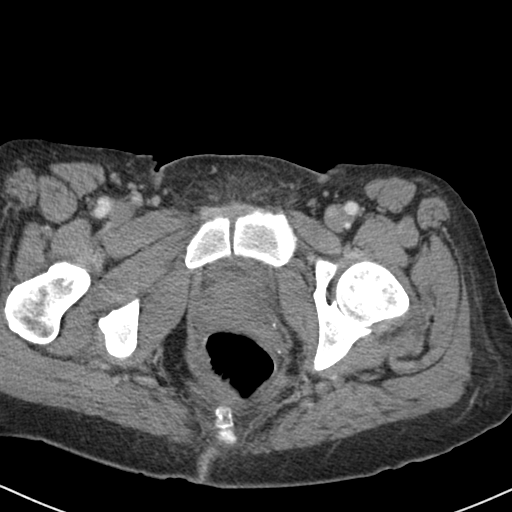
[im 33/156  soft-tissue]
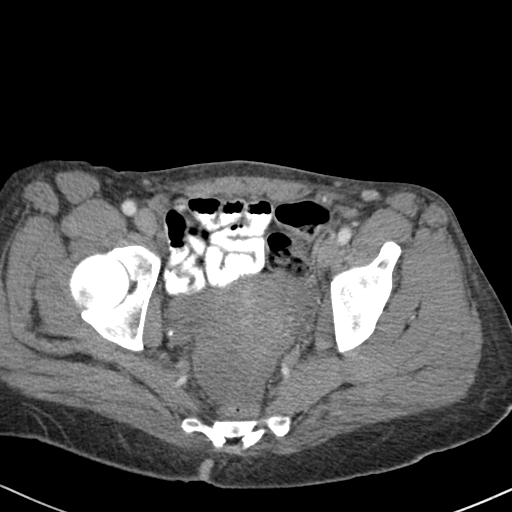
[im 46/156  soft-tissue]
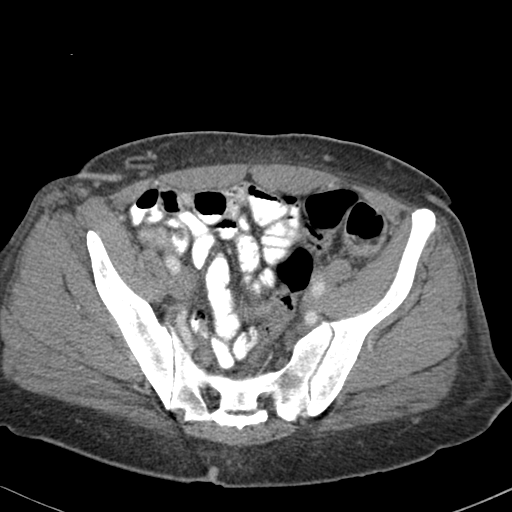
[im 59/156  soft-tissue]
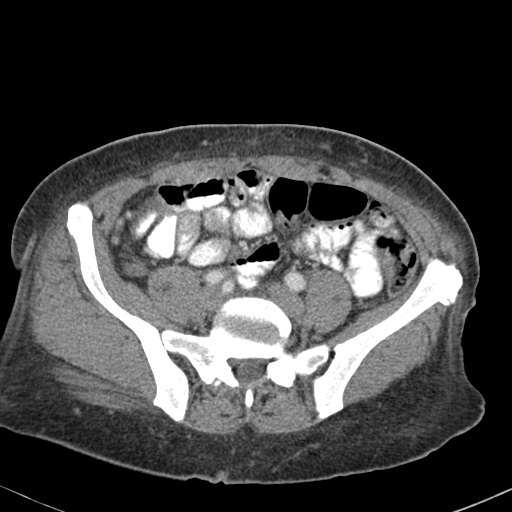
[im 72/156  soft-tissue]
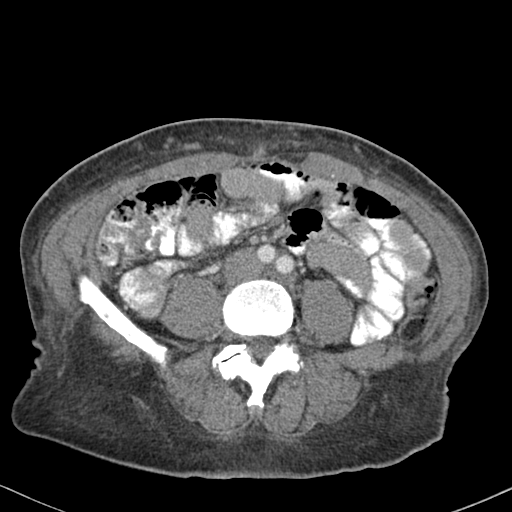
[im 84/156  soft-tissue]
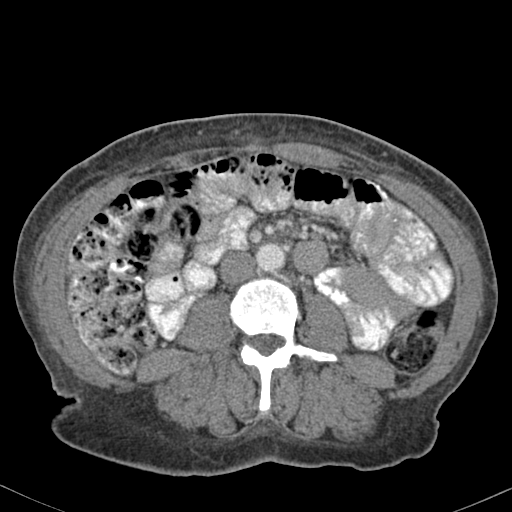
[im 97/156  soft-tissue]
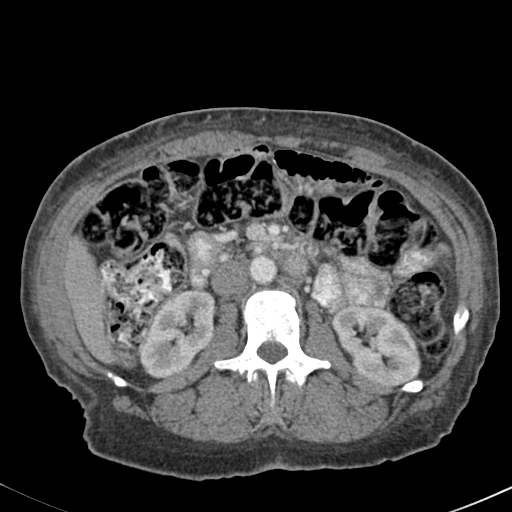
[im 110/156  soft-tissue]
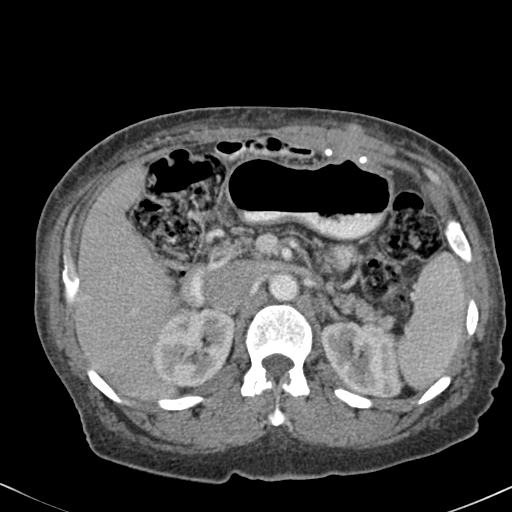
[im 110/156  bone]
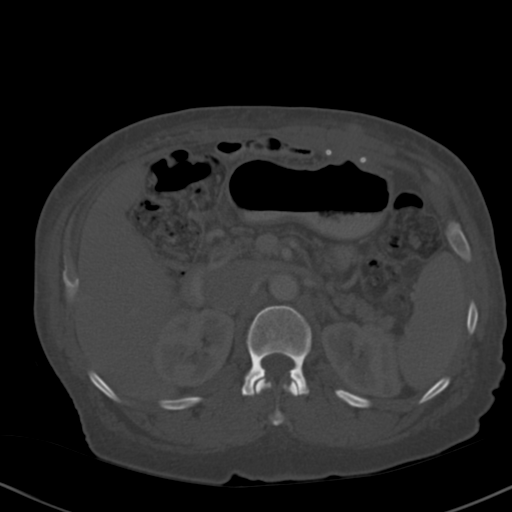
[im 123/156  soft-tissue]
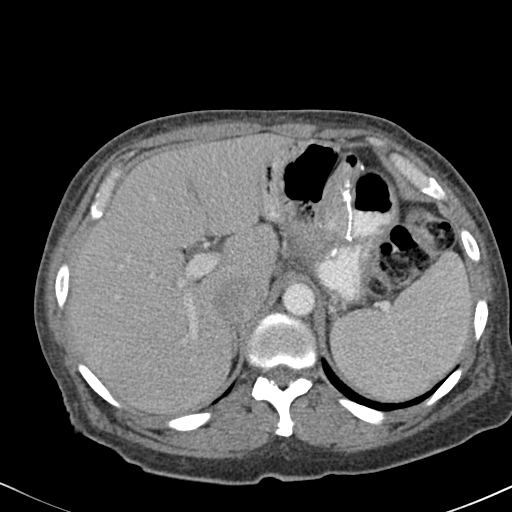
[im 130/156  lung]
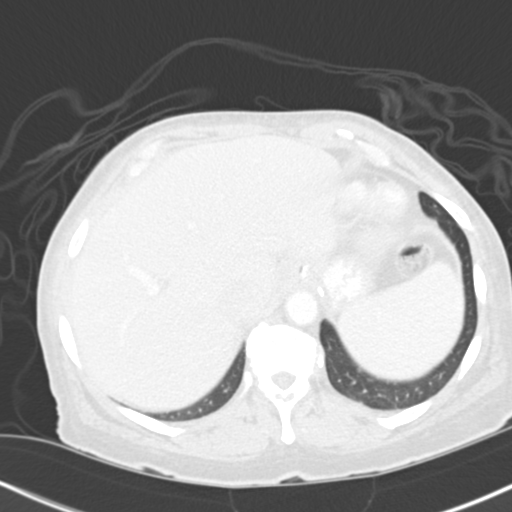
[im 136/156  soft-tissue]
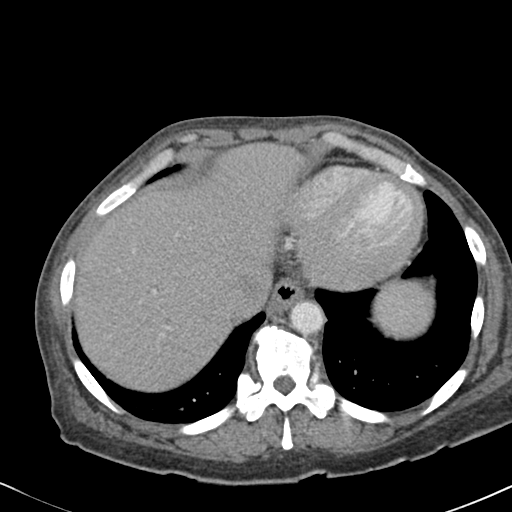
[im 136/156  lung]
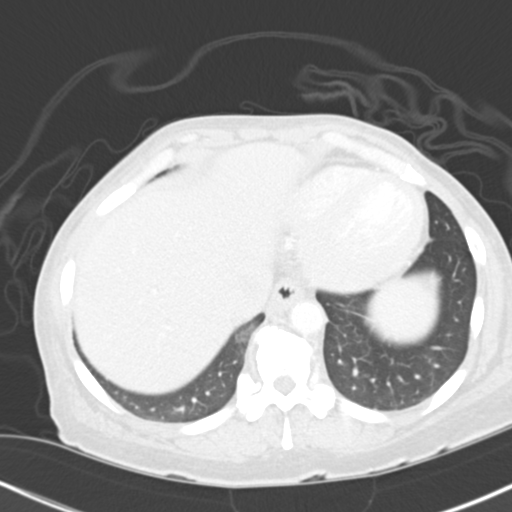
[im 143/156  lung]
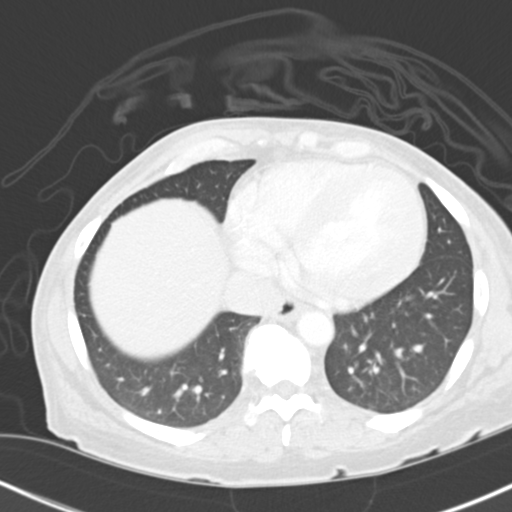
[im 149/156  soft-tissue]
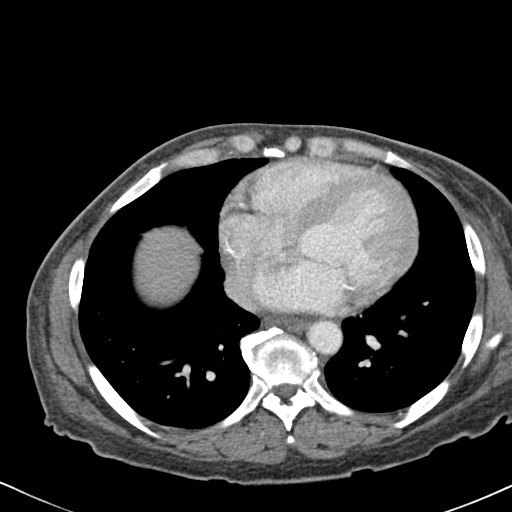
[im 149/156  lung]
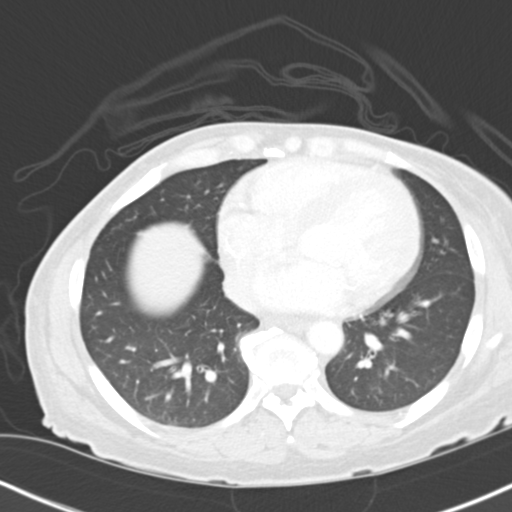

[14 of 32 positions shown; findings below may reference images not displayed]

PROCEDURE:     CT  - CT ABDOMEN / PELVIS  W  - October 22, 2012  [DATE]

RESULT:     Axial CT scanning was performed through the abdomen and pelvis
with reconstructions at 3 mm intervals and slice thicknesses following
intravenous demonstration of 100 cc of Isovue 300. Review of multiplanar
reconstructed images was performed separately on the VIA monitor.

The liver exhibits no focal mass. There is mild intrahepatic ductal
dilation. The gallbladder is surgically absent. The pancreas is mildly
atrophic but exhibits no evidence of inflammation or masses. The spleen is
not enlarged. There are postsurgical changes in the gastric bed due to the
patient's gastric bypass. There is a ringlike radiodense structure in the
anterior aspect of the epigastrium that may reflect an inflatable ring with
soft tissue extending to the surface of the abdomen and a small depression
noted within the skin. This may be an access point but correlation
clinically is needed. The stomach does not appear abnormally distended.

There is no evidence of a small bowel obstruction. Contrast has reached the
right colon. A large amount of stool is present throughout the colon
consistent with constipation. I do not see findings suggestive of
inflammatory change of the bowel. A structure is with an uninflamed appendix
is demonstrated.

There is fluid in the pelvis posterior to the uterus in the cul-de-sac.
There is no discrete adnexal mass the fluid in the posterior pelvis could be
related to the adnexal structures. The partially distended urinary bladder
is normal in appearance. The kidneys exhibit no acute abnormalities. There
are no adrenal masses. The caliber of the abdominal aorta is normal. The
lung bases are clear.
IMPRESSION: 1. The bowel gas pattern suggests constipation.
2. Within the pelvis there is a hypodense structures posterior and to the
right of the the uterus which may reflect a cystic ovarian process. Other
processes including a hydrosalpinx or tubo-ovarian abscess could present
similar fashion. Pelvic ultrasound is recommended.
3. There is no acute hepatobiliary nor acute urinary tract abnormality.

A preliminary report was sent to the [HOSPITAL] the conclusion
of the study.

[REDACTED]

## 2014-05-30 IMAGING — CT CT ABD-PELV W/ CM
1 of 2 series · 14 of 32 positions shown, 18 images · non-contrast
Comparison: none

REASON FOR EXAM: (1) abd pain heme + stool; (2) abd pain heme + stool
COMMENTS:

[Series 2: 3mm soft tissue · axial · 0.69mm/px · z∈[-1120,-670]mm · 14 of 164 slices shown, 18 images]
[im 7/164  soft-tissue]
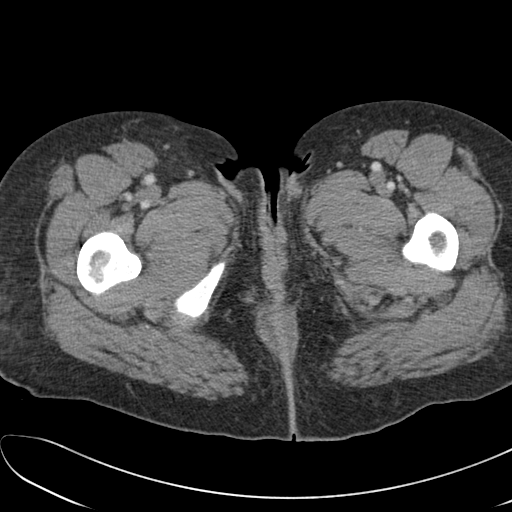
[im 7/164  bone]
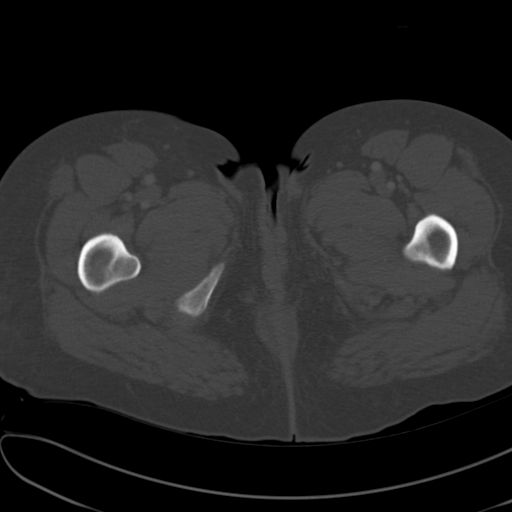
[im 21/164  soft-tissue]
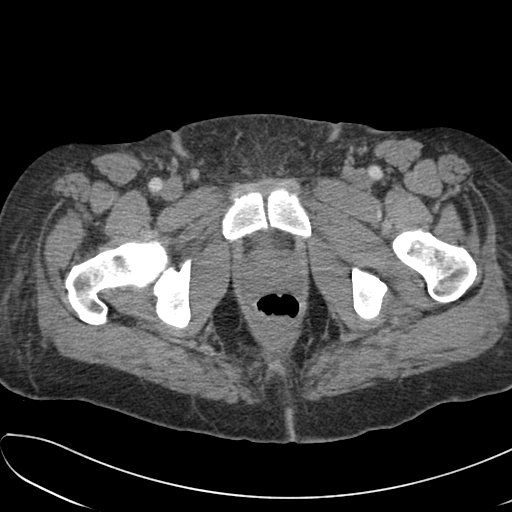
[im 34/164  soft-tissue]
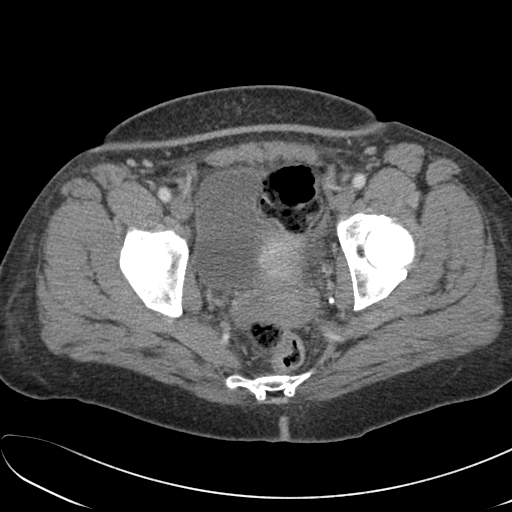
[im 48/164  soft-tissue]
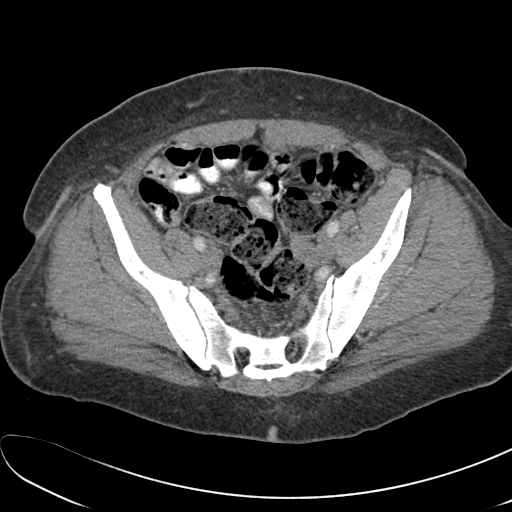
[im 62/164  soft-tissue]
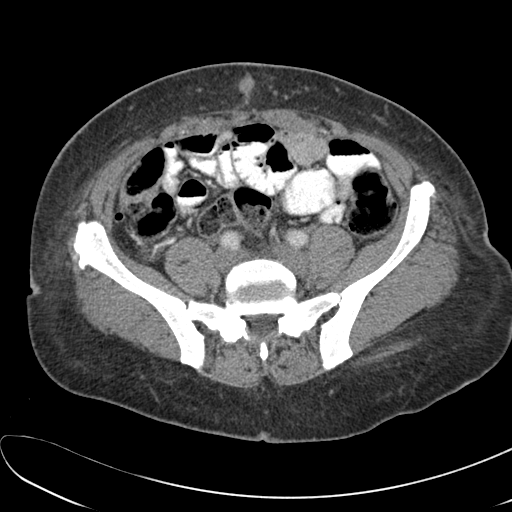
[im 75/164  soft-tissue]
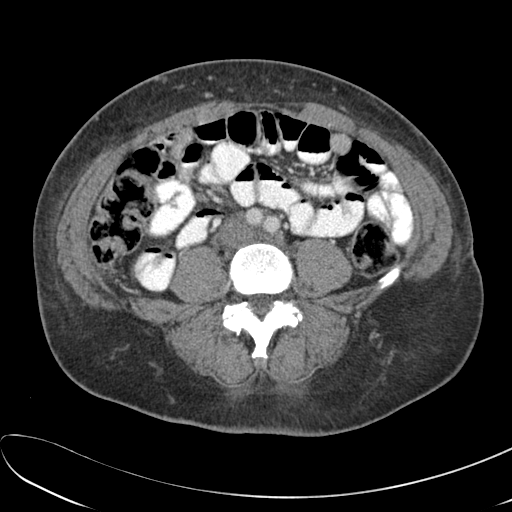
[im 89/164  soft-tissue]
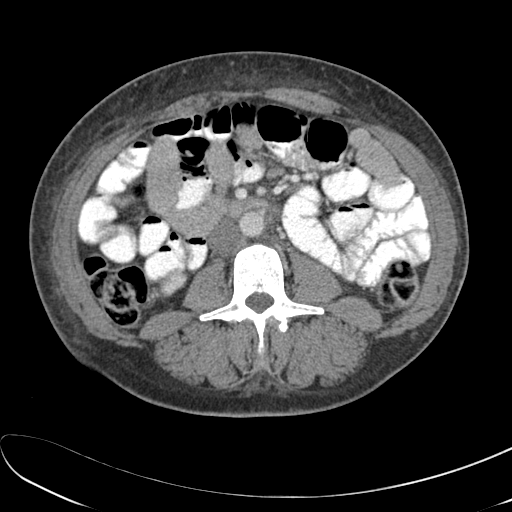
[im 102/164  soft-tissue]
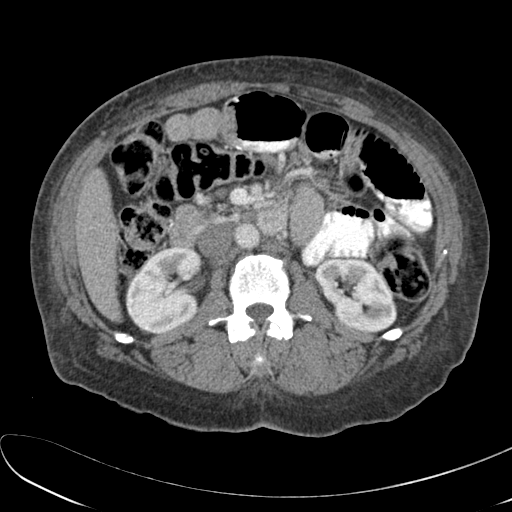
[im 116/164  soft-tissue]
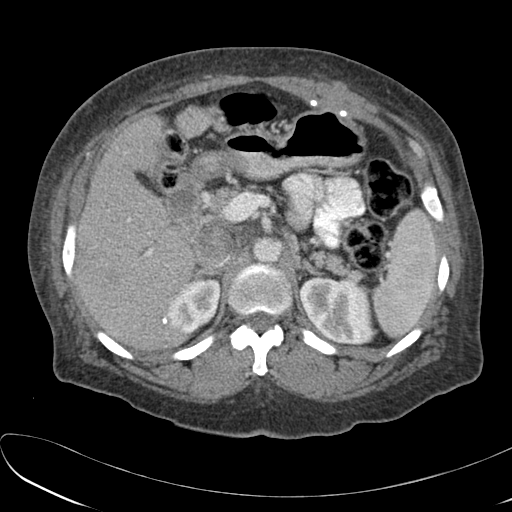
[im 116/164  bone]
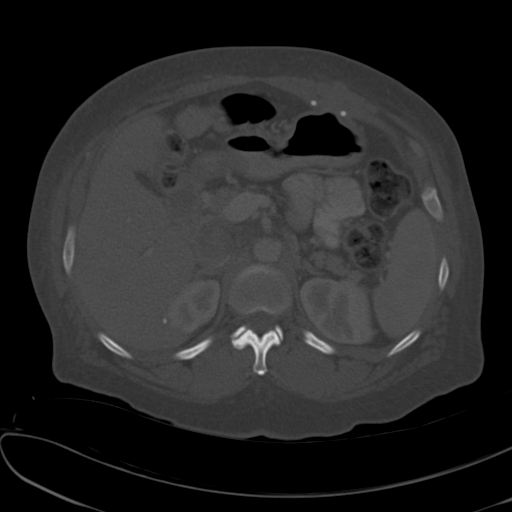
[im 130/164  soft-tissue]
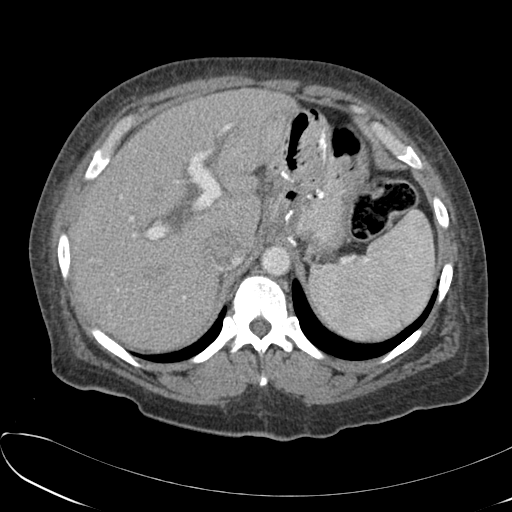
[im 136/164  lung]
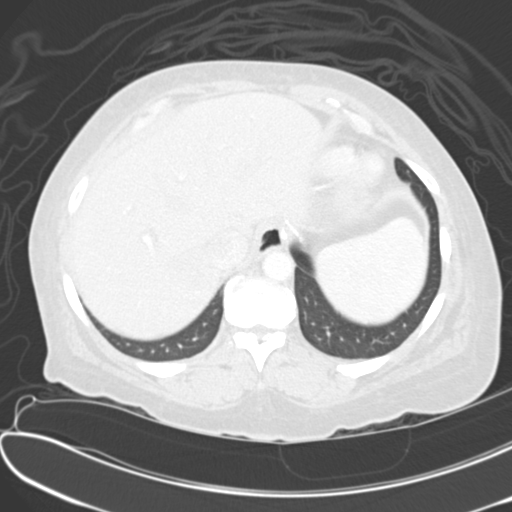
[im 143/164  soft-tissue]
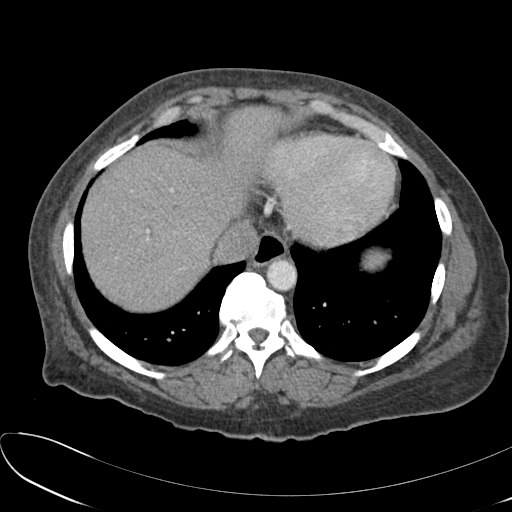
[im 143/164  lung]
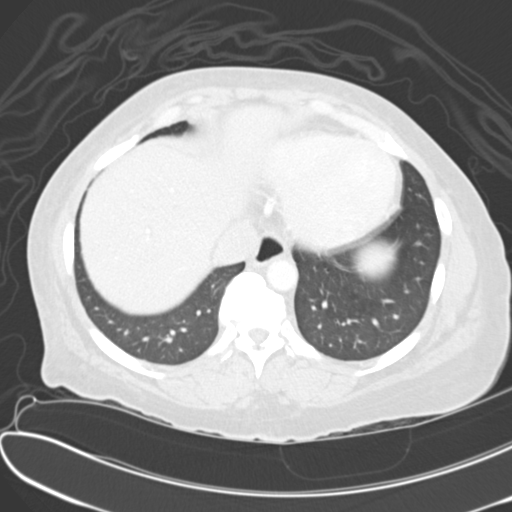
[im 150/164  lung]
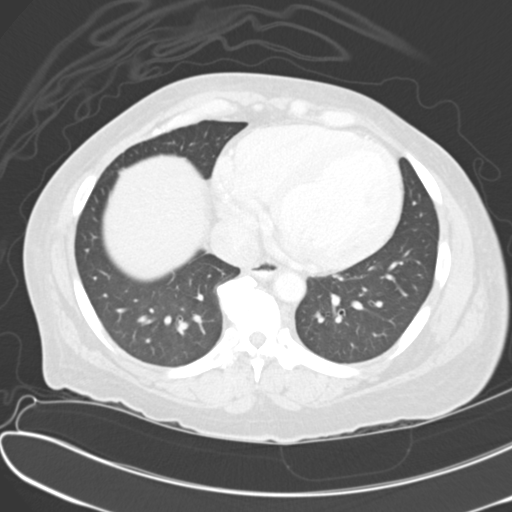
[im 157/164  soft-tissue]
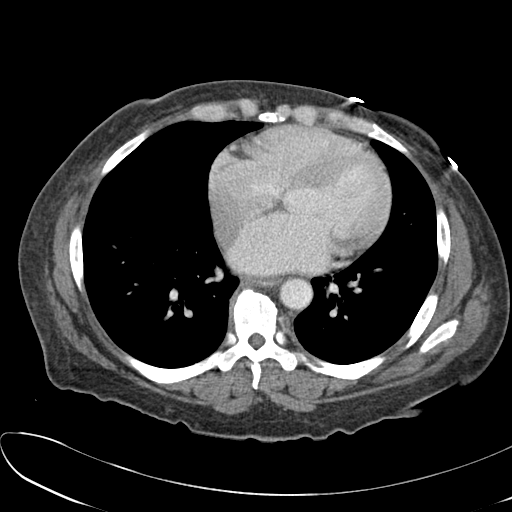
[im 157/164  lung]
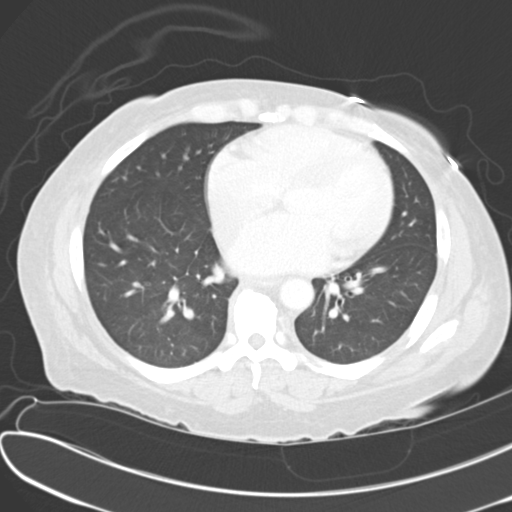

[14 of 32 positions shown; findings below may reference images not displayed]

PROCEDURE:     CT  - CT ABDOMEN / PELVIS  W  - January 30, 2013  [DATE]

RESULT:     Axial CT scanning was performed through the abdomen and pelvis
with reconstructions at 3 mm intervals and slice thicknesses. The patient
received oral contrast and also received 100 cc of Isovue 370 intravenously.
Comparison is made to the study October 22, 2012.

The gallbladder is surgically absent. There is a small amount of stable
intrahepatic ductal dilation. The spleen is top normal in size but stable in
appearance. The pancreas exhibits no evidence of masses or inflammation. The
stomach is nondistended. The patient may have undergone previous gastric
surgery. The duodenum and jejunum and ileum are within the limits of normal.
The orally administered contrast has not completely traversed the small
bowel. The colon is partially gas and stool filled with no evidence of
obstruction nor ileus. There is gas within the appendix.

Within the pelvis the partially distended urinary bladder is normal in
appearance. The uterus exhibits heterogeneous density which may reflect the
presence of multiple small fibroids. There is mild soft tissue fullness in
the left adnexal region. This is consistent with the ovary and fallopian
tube. There is no abnormal finding in the right adnexal region. A surgical
clip is present here.

There is no inguinal nor umbilical hernia. The caliber of the abdominal
aorta is normal. The kidneys and adrenal glands are normal in appearance.
The psoas musculature is also normal in appearance.

The lung bases are clear. The lumbar vertebral bodies are preserved in
height.
IMPRESSION: 1. Within the pelvis there is no abnormal mass or fluid collection. The left
adnexal structures are within the limits of normal for this study. The
uterus exhibits heterogeneous enhancement compatible with the presence
fibroids.
2. There is no evidence of a small or large bowel obstruction. I cannot
exclude an element of constipation in the appropriate clinical setting.
There is no evidence of colitis or diverticulitis.
3. The gallbladder is surgically absent. There is no evidence of acute
hepatobiliary abnormality.
4. There is surgical suture associated with the stomach which may be related
to previous gastric bypass.

[REDACTED]
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
## 2015-02-01 NOTE — Op Note (Signed)
PATIENT NAME:  Emily Rios, Emily Rios MR#:  [id number] DATE OF BIRTH:  [birth year]  DATE OF PROCEDURE:  12/28/2012  PREOPERATIVE DIAGNOSIS:  Pelvic pain with a right adnexal mass.   POSTOPERATIVE DIAGNOSES:   1.  Pelvic pain with a right adnexal mass.  2.  Hematosalpinx.  3.  Adhesions of the left fallopian tube.   PROCEDURES:  1.  Diagnostic laparoscopy.  2.  Right salpingo-oophorectomy.  3.  Moderate adhesiolysis of the left adnexa.   ESTIMATED BLOOD LOSS:  Minimal.   COMPLICATIONS:  None.   SPECIMENS:  Right tube and ovary.   DRAINS:  Foley, removed at the end of the case.   ANTIBIOTICS:  2 grams Ancef given IV preoperatively.   PROCEDURE IN DETAIL:  The patient presented through the Emergency Room complaining of pelvic pain. Ultrasound showed adnexal mass approximately 5 cm which had actually been noticed in January. The patient [facility] to [facility], but failed to do so. Please see details in the admission history and physical. I discussed options with patient. She strongly wanted to proceed with surgery tonight. I recommended a right salpingo-oophorectomy and reviewed the procedure, technique, risks, etc. Pt stated desire to proceed. Consent was signed.   She was taken to the operating room and placed in the supine position where anesthesia was initiated and then placed in the dorsal lithotomy position using Allen stirrups, prepped and draped in the usual fashion. The cervix was visualized and grasped with a single-tooth tenaculum. The Hulka was placed and Foley catheter was placed.    A #15 blade was used to make a 1 cm infraumbilical incision through which an 11 port was placed and pneumoperitoneum was established with findings as noted above. The patient was placed in Trendelenburg position. Under direct visualization, an 11 port was placed in the left lower quadrant and 5 in the right lower quadrant. Findings showed a dilated adherent right fallopian tube  and large right ovarian cyst. A Kleppinger was used to cauterize the infundibulopelvic as close to the adnexal mass as we could get and the Harmonic scalpel was used to divide the infundibulopelvic, the round ligament and the mesenteric portion of the fallopian tube, etc., until the tube and ovary were delivered. Endo Catch was placed through the left lower quadrant wound and the specimen was removed and sent for permanent pathology. The 11 port was replaced and the area was irrigated and seemed to be hemostatic. Then using the Harmonic scalpel, I dissected free the left fallopian tube from the left pelvic sidewall and the colon. The ovary appeared to be densely adherent along the ovarian fossa as the patient was asymptomatic and elected to proceed no further with dissection. Pressure was lowered to 6 mmHg and the areas were seen to be hemostatic. Ports were removed. Pneumoperitoneum was allowed to resolve.   The incisions were closed with deep of zero and subcutaneous with 3-0 Vicryl. Steri-Strips and Band-Aids were placed. All instrument and sponge counts were correct. Hulka, tenaculum and Foley were removed at the end of the case.   The patient tolerated the procedure well. Given the late hour, she will be placed in observation upstairs and sent home in the morning.    ____________________________ Clide Clifficky L. Logan BoresEvans, MD rle:si Rios: 12/28/2012 22:15:29 ET T: 12/28/2012 22:32:49 ET JOB#: [id number]  cc: Clide Clifficky L. Logan BoresEvans, MD, <Dictator> Augustina MoodICK L Marai Teehan MD ELECTRONICALLY SIGNED 12/29/2012 9:30

## 2017-03-14 DIAGNOSIS — F250 Schizoaffective disorder, bipolar type: Secondary | ICD-10-CM

## 2021-01-13 DIAGNOSIS — I63511 Cerebral infarction due to unspecified occlusion or stenosis of right middle cerebral artery: Secondary | ICD-10-CM

## 2021-02-04 DIAGNOSIS — I63511 Cerebral infarction due to unspecified occlusion or stenosis of right middle cerebral artery: Secondary | ICD-10-CM

## 2021-02-05 DIAGNOSIS — I63511 Cerebral infarction due to unspecified occlusion or stenosis of right middle cerebral artery: Secondary | ICD-10-CM

## 2021-02-06 DIAGNOSIS — I63511 Cerebral infarction due to unspecified occlusion or stenosis of right middle cerebral artery: Secondary | ICD-10-CM

## 2021-08-06 ENCOUNTER — Ambulatory Visit (INDEPENDENT_AMBULATORY_CARE_PROVIDER_SITE_OTHER): Admitting: Family Medicine

## 2021-08-06 ENCOUNTER — Encounter (INDEPENDENT_AMBULATORY_CARE_PROVIDER_SITE_OTHER): Payer: Self-pay | Admitting: Family Medicine

## 2021-08-06 MED ORDER — TRAMADOL HCL 50 MG OR TABS
ORAL_TABLET | ORAL | Status: AC
Start: 2021-08-05 — End: ?

## 2021-08-06 MED ORDER — AMIODARONE HCL 200 MG OR TABS
ORAL_TABLET | ORAL | Status: AC
Start: 2021-07-23 — End: ?

## 2021-08-06 MED ORDER — INSULIN LISPRO (HUMAN) 100 UNIT/ML SC SOLN (CUSTOM)
INTRAMUSCULAR | Status: AC
Start: 2021-07-28 — End: ?

## 2021-08-06 MED ORDER — AMLODIPINE 10 MG OR TABS
ORAL_TABLET | ORAL | Status: AC
Start: 2021-07-23 — End: ?

## 2021-08-06 MED ORDER — LISINOPRIL 20 MG OR TABS
ORAL_TABLET | ORAL | Status: AC
Start: 2021-07-23 — End: ?

## 2021-08-06 MED ORDER — FLUOXETINE HCL 20 MG OR CAPS
ORAL_CAPSULE | ORAL | Status: AC
Start: 2021-07-23 — End: ?

## 2021-08-06 MED ORDER — FAMOTIDINE 20 MG OR TABS
ORAL_TABLET | ORAL | Status: AC
Start: 2021-07-23 — End: ?

## 2021-08-06 MED ORDER — GLUCAGON EMERGENCY 1 MG IJ KIT
PACK | INTRAMUSCULAR | Status: AC
Start: 2021-06-12 — End: ?

## 2021-08-06 MED ORDER — QUETIAPINE FUMARATE 100 MG OR TABS: 1.0000 | ORAL_TABLET | ORAL | Status: AC

## 2021-08-06 MED ORDER — ATORVASTATIN CALCIUM 20 MG OR TABS
ORAL_TABLET | ORAL | Status: AC
Start: 2021-07-23 — End: ?

## 2021-08-12 ENCOUNTER — Encounter (INDEPENDENT_AMBULATORY_CARE_PROVIDER_SITE_OTHER): Payer: Self-pay

## 2021-08-12 ENCOUNTER — Institutional Professional Consult (permissible substitution) (INDEPENDENT_AMBULATORY_CARE_PROVIDER_SITE_OTHER): Admitting: Anesthesiology

## 2021-08-15 ENCOUNTER — Telehealth (INDEPENDENT_AMBULATORY_CARE_PROVIDER_SITE_OTHER): Payer: Self-pay | Admitting: Family Medicine

## 2021-08-18 ENCOUNTER — Institutional Professional Consult (permissible substitution) (INDEPENDENT_AMBULATORY_CARE_PROVIDER_SITE_OTHER): Admitting: Anesthesiology

## 2021-08-20 ENCOUNTER — Institutional Professional Consult (permissible substitution) (INDEPENDENT_AMBULATORY_CARE_PROVIDER_SITE_OTHER): Admitting: Anesthesiology

## 2021-08-29 ENCOUNTER — Institutional Professional Consult (permissible substitution) (INDEPENDENT_AMBULATORY_CARE_PROVIDER_SITE_OTHER): Admitting: Anesthesiology

## 2021-09-10 ENCOUNTER — Encounter (INDEPENDENT_AMBULATORY_CARE_PROVIDER_SITE_OTHER): Payer: Self-pay | Admitting: Anesthesiology

## 2021-09-10 ENCOUNTER — Telehealth (INDEPENDENT_AMBULATORY_CARE_PROVIDER_SITE_OTHER): Payer: Self-pay | Admitting: Anesthesiology

## 2021-09-10 ENCOUNTER — Ambulatory Visit (INDEPENDENT_AMBULATORY_CARE_PROVIDER_SITE_OTHER): Admitting: Anesthesiology

## 2021-09-10 VITALS — BP 127/79 | HR 54

## 2021-09-10 MED ORDER — ELIQUIS 2.5 MG PO TABS
ORAL_TABLET | ORAL | Status: AC
Start: 2021-08-12 — End: ?

## 2021-10-22 ENCOUNTER — Encounter (INDEPENDENT_AMBULATORY_CARE_PROVIDER_SITE_OTHER): Payer: Self-pay | Admitting: Neurology

## 2021-10-22 ENCOUNTER — Encounter (INDEPENDENT_AMBULATORY_CARE_PROVIDER_SITE_OTHER): Admitting: Neurology

## 2021-10-22 NOTE — Interdisciplinary (Signed)
I, Macee Venables, roomed and reviewed patient's medication and allergies.

## 2021-11-11 ENCOUNTER — Encounter (INDEPENDENT_AMBULATORY_CARE_PROVIDER_SITE_OTHER): Admitting: Neurology

## 2022-02-03 NOTE — Progress Notes (Deleted)
INTERVENTIONAL PAIN MEDICINE - FOLLOW UP VISIT    PRIMARY CARE PROVIDER:   Lamonte Sakai or REFERRING PHYSICIAN:   Cheryll Cockayne, MD  [address]  [address]  [place],  [place] [address]    History of Illness:  This is a 61 year old female with PMH of HTN, DM2, CVA (01/2021) c/b left hemiplegia on Eliquis, asthma, bipolar disorder, and prior methamphetamine abuse. Here for interventional pain management consultation on 08/15/21 for evaluation of left arm pain. 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 Primary complaint today is left arm pain that started after stroke this year. Describes aching, burning pain throughout left arm. Associated with tingling. Pain worse with touching arm. Improves with rest. Has hemiplegia on left after stroke. Currently going to PT which worsens pain. Lives in skilled [place]. Manages pain with Tramadol, gabapentin and aleve. Feels only aleve is helpful.    Interval:  Here for follow of left arm pain. ***    Pain Description:  Duration: since CVA in April 2022  Location: left arm  Description: constant with intermittent flares  Radiating symptoms: denies  Severity: 8/10 (ranges 6-10)  Aggravating factors: lifting, moving left arm   Alleviated by: lying down  Associated symptoms: denies LE weakness, saddle anesthesia, or bowel/bladder incontinence    Pain Treatments:  Interventional  None    Non-Interventional  PT - currently going post-stroke    Current  Pain Medications:  Gabapentin 299m TID  Tramadol 558mBID   Aleve    Past Pain Medications:  Norco    Current Outpatient Medications   Medication Sig Dispense Refill   . amiodarone (PACERONE) 200 MG tablet      . amLODIPINE (NORVASC) 10 MG tablet      . atorvastatin (LIPITOR) 20 MG tablet      . ELIQUIS 2.5 MG      . famotidine (PEPCID) 20 MG tablet      . FLUoxetine (PROZAC) 20 MG capsule      . GLUCAGON EMERGENCY 1 MG emergency injection kit      . insulin lispro (HUMALOG OR ADMELOG)  100 units/mL injection      . lisinopril (PRINIVIL, ZESTRIL) 20 MG tablet      . QUEtiapine (SEROQUEL) 100 MG tablet 1 tablet (100 mg) by Oral route.     . traMADol (ULTRAM) 50 MG tablet        No current facility-administered medications for this visit.       No Known Allergies    No past medical history on file.  No past surgical history on file.  Social History     Socioeconomic History   . Marital status: Single     Spouse name: Not on file   . Number of children: Not on file   . Years of education: Not on file   . Highest education level: Not on file   Occupational History   . Not on file   Tobacco Use   . Smoking status: Never   . Smokeless tobacco: Never   Vaping Use   . Vaping status: Not on file   Substance and Sexual Activity   . Alcohol use: Not Currently   . Drug use: Not on file   . Sexual activity: Not on file   Other Topics Concern   . Not on file   Social History Narrative   . Not on file     Social  Determinants of Health     Financial Resource Strain: Not on file   Food Insecurity: Not on file   Transportation Needs: Not on file   Physical Activity: Not on file   Stress: Not on file   Social Connections: Not on file   Intimate Partner Violence: Not on file   Housing Stability: Not on file       No family history on file.      Physical Exam:   Vitals: There were no vitals taken for this visit.  General appearance: Well appearing, in no acute distress, alert. Mood and affect appropriate.  HEENT: Normocephalic, atraumatic.  Pulmonary: Breathing comfortably without use of accessory muscles.    Cardiac: No lower extremity edema.   Neck: Full ROM without pain. No TTP over cervical paraspinal muscles. Facet loading negative bilaterally.  MSK: Swelling noted in left hand. Mild TTP noted throughout left arm.   Neuro: CNII-XII grossly intact. No loss of sensation is noted. Strength 5/5 throughout right upper extremity and 0/5 throughout left upper extremity.  Ambulation: Pt is able to raise from a seated  position without difficulty. Gait is not antalgic and the patient ambulates without assistance.       Imaging/Diagnostics:  None available.       Diagnosis:  Encounter Diagnoses   Name Primary?   . Central pain syndrome Yes   . Neuropathic pain        Assessment and Plan:  Michelle Higgins is a 61 year old female with PMH of HTN, DM2, CVA (01/2021) c/b left hemiplegia on Eliquis, asthma, bipolar disorder, and prior methamphetamine abuse. Here for evaluation of left arm pain. Patient has chronic aching, burning pain diffusely throughout left arm that started after recent stroke. 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 Discussed up titration of gabapentin for improved pain control.      Interventions  - None at this time.    Medications  - On aleve, gabapentin 251m BID, and Tramadol 575m1-2 tabs q6h prn. Medications managed by PCP at skilled nursing facility.  - Recommend increasing gabapentin to 30065mID. If pain persists, can gradually increase gabapentin to 600m2mD - start by increasing nighttime dose x 5 days, then increase morning dose x 5 days, then increase afternoon dose (300/300/600mg5m days -> 600/300/600mg 16mdays -> 600/600/600mg).40mIf patient does not respond to gabapentin after up titration, can consider switching to Lyrica.   - Recommend weaning Tramadol as patient does not note benefit with this medication.     Tests/Other  - Imaging/Diagnostics: None available.   - Discussed conservative therapies, including PT - currently going for post-stroke care.       Thank you for allowing me to participate in the care of this patient.       Follow up: ***        Ankita Newcomer PrArvella MerlesPH  Interventional Pain Management  [facility] 25, 2023       This was a visit of 60 *** minutes. Greater than 50% of the time was spent counseling the patient face-to-face regarding disease management.

## 2022-02-04 ENCOUNTER — Encounter (INDEPENDENT_AMBULATORY_CARE_PROVIDER_SITE_OTHER): Admitting: Anesthesiology

## 2022-02-04 DIAGNOSIS — G890 Central pain syndrome: Secondary | ICD-10-CM

## 2022-02-04 DIAGNOSIS — M792 Neuralgia and neuritis, unspecified: Secondary | ICD-10-CM

## 2022-05-06 NOTE — Telephone Encounter (Signed)
Location of patient: [place]    Subjective: Caller states "is her blood pressure ok"     Current Symptoms: Caller states patient's blood pressure is 120/50 and wants to know if that is an ok blood pressure. Patient has a history of stroke. Patient is asymptomatic and can be heard in the background stating, "I am ok." Caller states patient is tired from increased activity today. Caller denies dehydration. Patient does not want to schedule an appointment for a BP check.     Pain Severity: 0/10;     Temperature: denies    What has been tried: n/a    Recommended disposition: Home Care    Care advice provided, patient verbalizes understanding; denies any other questions or concerns.    Outcome:  Patient/caller agrees with recommendation.      This triage is a result of a call to the [facility] Nurse Line      Reason for Disposition   Nursing judgment or information in reference    Protocols used: No Guideline Available-ADULT-AH

## 2023-02-16 NOTE — Progress Notes (Signed)
The patient missed the appointment.
